# Patient Record
Sex: Female | Born: 1970 | Race: Black or African American | Hispanic: No | Marital: Single | Smoking: Never smoker
Health system: Southern US, Community
[De-identification: ages and names within clinical notes are randomized; demographics above are authoritative.]

## PROBLEM LIST (undated history)

## (undated) HISTORY — PX: TONSILLECTOMY: SUR1361

---

## 2001-07-03 ENCOUNTER — Encounter: Payer: Self-pay | Admitting: Emergency Medicine

## 2001-07-03 ENCOUNTER — Emergency Department (HOSPITAL_COMMUNITY): Admission: EM | Admit: 2001-07-03 | Discharge: 2001-07-03 | Payer: Self-pay | Admitting: Emergency Medicine

## 2002-03-24 ENCOUNTER — Other Ambulatory Visit: Admission: RE | Admit: 2002-03-24 | Discharge: 2002-03-24 | Payer: Self-pay

## 2002-07-20 ENCOUNTER — Other Ambulatory Visit: Admission: RE | Admit: 2002-07-20 | Discharge: 2002-07-20 | Payer: Self-pay | Admitting: Obstetrics & Gynecology

## 2003-06-04 ENCOUNTER — Emergency Department (HOSPITAL_COMMUNITY): Admission: AD | Admit: 2003-06-04 | Discharge: 2003-06-04 | Payer: Self-pay | Admitting: Family Medicine

## 2003-10-03 ENCOUNTER — Other Ambulatory Visit: Admission: RE | Admit: 2003-10-03 | Discharge: 2003-10-03 | Payer: Self-pay | Admitting: Obstetrics & Gynecology

## 2004-04-29 ENCOUNTER — Inpatient Hospital Stay (HOSPITAL_COMMUNITY): Admission: AD | Admit: 2004-04-29 | Discharge: 2004-04-29 | Payer: Self-pay | Admitting: Obstetrics and Gynecology

## 2004-06-17 ENCOUNTER — Inpatient Hospital Stay (HOSPITAL_COMMUNITY): Admission: AD | Admit: 2004-06-17 | Discharge: 2004-06-20 | Payer: Self-pay | Admitting: Obstetrics & Gynecology

## 2004-06-25 ENCOUNTER — Encounter: Admission: RE | Admit: 2004-06-25 | Discharge: 2004-07-25 | Payer: Self-pay | Admitting: Obstetrics & Gynecology

## 2004-07-19 ENCOUNTER — Emergency Department (HOSPITAL_COMMUNITY): Admission: EM | Admit: 2004-07-19 | Discharge: 2004-07-19 | Payer: Self-pay | Admitting: Family Medicine

## 2004-07-26 ENCOUNTER — Encounter: Admission: RE | Admit: 2004-07-26 | Discharge: 2004-08-13 | Payer: Self-pay | Admitting: Obstetrics & Gynecology

## 2004-08-01 ENCOUNTER — Other Ambulatory Visit: Admission: RE | Admit: 2004-08-01 | Discharge: 2004-08-01 | Payer: Self-pay | Admitting: Obstetrics & Gynecology

## 2005-02-20 ENCOUNTER — Emergency Department (HOSPITAL_COMMUNITY): Admission: EM | Admit: 2005-02-20 | Discharge: 2005-02-21 | Payer: Self-pay | Admitting: Emergency Medicine

## 2006-05-05 ENCOUNTER — Emergency Department (HOSPITAL_COMMUNITY): Admission: EM | Admit: 2006-05-05 | Discharge: 2006-05-05 | Payer: Self-pay | Admitting: Emergency Medicine

## 2006-07-07 ENCOUNTER — Emergency Department (HOSPITAL_COMMUNITY): Admission: EM | Admit: 2006-07-07 | Discharge: 2006-07-07 | Payer: Self-pay | Admitting: Emergency Medicine

## 2006-07-24 ENCOUNTER — Emergency Department (HOSPITAL_COMMUNITY): Admission: EM | Admit: 2006-07-24 | Discharge: 2006-07-24 | Payer: Self-pay | Admitting: Family Medicine

## 2007-06-27 ENCOUNTER — Inpatient Hospital Stay (HOSPITAL_COMMUNITY): Admission: AD | Admit: 2007-06-27 | Discharge: 2007-06-27 | Payer: Self-pay | Admitting: Obstetrics and Gynecology

## 2008-01-11 ENCOUNTER — Inpatient Hospital Stay (HOSPITAL_COMMUNITY): Admission: AD | Admit: 2008-01-11 | Discharge: 2008-01-14 | Payer: Self-pay | Admitting: Obstetrics & Gynecology

## 2009-03-31 ENCOUNTER — Emergency Department (HOSPITAL_COMMUNITY): Admission: EM | Admit: 2009-03-31 | Discharge: 2009-03-31 | Payer: Self-pay | Admitting: Emergency Medicine

## 2010-03-18 ENCOUNTER — Encounter: Payer: Self-pay | Admitting: Obstetrics & Gynecology

## 2010-03-21 ENCOUNTER — Ambulatory Visit: Admit: 2010-03-21 | Payer: Self-pay | Admitting: Family Medicine

## 2010-06-04 ENCOUNTER — Other Ambulatory Visit: Payer: Self-pay | Admitting: Obstetrics & Gynecology

## 2010-06-04 DIAGNOSIS — Z1231 Encounter for screening mammogram for malignant neoplasm of breast: Secondary | ICD-10-CM

## 2010-07-10 NOTE — Op Note (Signed)
NAMECASSEY, [name]              ACCOUNT NO.:  [id number]   MEDICAL RECORD NO.:  [id number]          PATIENT TYPE:  INP   LOCATION:  9130                          FACILITY:  [facility]   PHYSICIAN:  Gerrit Friends. Aldona Bar, M.D.   DATE OF BIRTH:  1970/11/09   DATE OF PROCEDURE:  01/11/2008  DATE OF DISCHARGE:                               OPERATIVE REPORT   PREOPERATIVE DIAGNOSES:  1. A 39-week intrauterine pregnancy.  2. Previous cesarean section.  3. Desire for repeat cesarean section.  4. Early active labor.   POSTOPERATIVE DIAGNOSES:  1. A 39-week intrauterine pregnancy.  2. Previous cesarean section.  3. Desire for repeat cesarean section.  4. Early active labor.  5. Delivery of 7 pounds 6 ounces female infant, Apgars of 9 and 9, and      small pedunculated myomas on the surface uterus.   PROCEDURE:  Repeat low transverse cesarean section.   SURGEON:  Gerrit Friends. Aldona Bar, MD   ANESTHESIA:  Subarachnoid block, Dr. Jean Rosenthal.   HISTORY:  This 40 year old gravida 4, para 1 was scheduled for repeat  cesarean section on January 13, 2008, presented to the office on the  afternoon of January 11, 2008, having early irregular labor type  contractions with some discomfort.  Decision was made to proceed with  delivery, and the patient was sent to maternity admissions.  Upon  arrival on maternity admissions, she was appropriately worked up and  transferred to the operating room where her IV was started, and a  preoperative dose of 2 g of ampicillin IV was given because she was  positive for group B strep.   Thereafter, a spinal anesthetic was carried out by Dr. Jean Rosenthal without  difficulty, and after good anesthetic levels were documented with the  patient in the supine position slightly tilted to left, she was prepped  and draped in usual fashion with a Foley catheter inserted as part of  the prep.   At this time with good anesthetic levels documented, procedure was  begun.  A Pfannenstiel  incision was made and dissected down sharply to  and through the fascia in low transverse fashion without difficulty.  Hemostasis was created at each layer.  Subfascial space was created  inferiorly and superiorly, muscles separated in the midline.  Peritoneum  was identified and entered appropriate with care taken to avoid the  bowel superiorly and the bladder inferiorly.  At this time, the  peritoneum was extended, and with minimal difficulty, the bladder blade  placed.  The vesicouterine peritoneum was identified, entered  appropriately in the low transverse fashion, and pushed off the lower  uterine segment with ease.  At this time, a sharp incision into the  uterus in low transverse fashion was made with Metzenbaum scissors and  extended laterally with the fingers.  Fluid was clear, and with the aid  of the vacuum extractor, delivery of the 7-pound 6-ounce female infant  with Apgars of 9 and 9 was carried out from vertex position.   After the cord was clamped and cut, the infant was passed off to the  team and was  subsequently taken to the nursery in good condition.  As  mentioned, weight was 7 pounds 6 ounces, Apgar 9 and 9.   After the cord bloods were collected, the placenta was delivered intact.  Uterus was then exteriorized, rendered free of any remaining products of  conception, and then, good uterine contractility was affordable with  slowly giving intravenous Pitocin and manual stimulation.  At this time,  the uterine incision was closed using a single layer of #1 Vicryl in a  running locking fashion.  This was oversewn with several figure-of-eight  #1 Vicryl for additional hemostasis.  The bladder flap was  reapproximated using 3-0 Vicryl in interrupted fashion.  Tubes and  ovaries were inspected and noted to be normal.  There was a 2- to 3-cm  pedunculated myoma on the right lateral surface of the uterus and  another one on the anterior surface of the uterus.  There were  also some  small what felt to be intramural myomas in the lower right anterior  surface of the uterus at lower right uterine body.   Once the uterine incision was closed with all counts being correct, the  abdomen was lavaged of all free blood and clot.  Then, the uterus was  replaced into abdominal cavity and good irrigation was carried out.  Incision was reinspected and noted to be dry.  At this time, closure of  the abdomen was carried out in layers.  The abdominal peritoneum was  closed with 0 Vicryl in a running fashion and muscles secured with same.  Assured of good fascial hemostasis, the fascia was then reapproximated  using 0 Vicryl from angle to midline bilaterally.   Subcutaneous tissue was rendered hemostatic, and staples were used to  close the skin.  Sterile pressure dressing was applied.  The patient at  this time was transported to recovery in satisfactory condition, having  tolerated the procedure well.  Estimated blood loss 500 mL.  All counts  were correct x2.  At conclusion, mother and baby were both doing well in  their respective recovery areas at the conclusion of procedure.  This  patient presented in early active labor, desirous of repeat cesarean  section, was taken to the operating room for such procedure.  She was  originally scheduled to be done electively on November 18.   All counts correct x2.  Estimated loss blood loss 500 mL.       Gerrit Friends. Aldona Bar, M.D.  Electronically Signed     RMW/MEDQ  D:  01/11/2008  T:  01/12/2008  Job:  [id number]

## 2010-07-12 ENCOUNTER — Ambulatory Visit: Payer: Self-pay

## 2010-07-13 NOTE — Discharge Summary (Signed)
NAMEJENNFIER, [name]              ACCOUNT NO.:  [id number]   MEDICAL RECORD NO.:  [id number]          PATIENT TYPE:  INP   LOCATION:  9130                          FACILITY:  [facility]   PHYSICIAN:  Malva Limes, M.D.    DATE OF BIRTH:  [birth year]   DATE OF ADMISSION:  01/11/2008  DATE OF DISCHARGE:  01/14/2008                               DISCHARGE SUMMARY   FINAL DIAGNOSES:  1. Intrauterine pregnancy at 71 weeks' gestation.  2. History of previous cesarean section.  3. The patient desires repeat cesarean section.  4. Early active labor.   PROCEDURE:  Repeat low transverse cesarean section.   SURGEON:  Gerrit Friends. Aldona Bar, MD   COMPLICATIONS:  None.   This 40 year old G4, P1-0-2-1, presents at 82 weeks' gestation in early  labor.  The patient had a prior cesarean section with her last pregnancy  and desired to repeat with this pregnancy as well as scheduled for one  on January 13, 2008.  Because of active labor at this point, a decision  was made to proceed with a cesarean section currently.  Otherwise, the  patient's antepartum course up to this point had been complicated by  advanced maternal age.  The patient declined any genetic screening.  She  is positive group B strep and did receive 2 g of ampicillin IV prior to  her section.  The patient was taken to the operating room on January 11, 2008, by Dr. Annamaria Helling, a repeat low transverse cesarean section  was performed with the delivery of 7 pounds 6 ounces female infant with  Apgars of 9 and 9.  There was a small pedunculated myoma on the cervix  of the uterus noted.  The delivery without complications.  The patient's  postoperative course was benign, without any significant fevers.  The  patient did want her little boy circumcised prior to discharge, which  occurred, and the patient was felt ready for discharge on postoperative  day #3.  She was sent home on a regular diet, told to decrease  activities, told to continue her  prenatal vitamins, was given Percocet 1-  2 every 4-6 hours as needed for pain, and was to follow up in our office  in 4-6 weeks.  Instructions and precautions were reviewed with the  patient.   LABS ON DISCHARGE:  The patient had a hemoglobin of 10.3, white blood  cell count 9.1, and platelets of 122,000.     Leilani Able, P.A.-C.      ______________________________  Malva Limes, M.D.   MB/MEDQ  D:  02/14/2008  T:  02/14/2008  Job:  [id number]

## 2010-07-13 NOTE — Op Note (Signed)
NAMEVALEN, [name]              ACCOUNT NO.:  [id number]   MEDICAL RECORD NO.:  [id number]          PATIENT TYPE:  INP   LOCATION:  9145                          FACILITY:  [facility]   PHYSICIAN:  Freddy Finner, M.D.   DATE OF BIRTH:  10-Dec-1970   DATE OF PROCEDURE:  06/17/2004  DATE OF DISCHARGE:                                 OPERATIVE REPORT   PREOPERATIVE DIAGNOSES:  1.  Intrauterine pregnancy at term.  2.  Failure to progress in labor due to cephalopelvic disproportion.  3.  Nonreassuring fetal heart tracing.  4.  Meconium staining of amniotic fluid.   POSTOPERATIVE DIAGNOSES:  1.  Intrauterine pregnancy at term.  2.  Failure to progress in labor due to cephalopelvic disproportion.  3.  Nonreassuring fetal heart tracing.  4.  Meconium staining of amniotic fluid.   OPERATIVE PROCEDURE:  Primary low transverse cervical cesarean section with  delivery of viable female infant, Apgars of 8/9 assigned by NICU attendants.  Arterial cord pH is pending.   SURGEON:  Freddy Finner, M.D.   ANESTHESIA:  Epidural.   ESTIMATED INTRAOPERATIVE BLOOD LOSS:  Less than or equal to 800 mL.   SECONDARY DIAGNOSES:  Fundal fibroids, one near the right cornu posterior to  the interstitial portion of the fallopian tube on the right and one  pedunculated lying on the anterior fundal surface near the apex of the  uterus.   INTRAOPERATIVE COMPLICATIONS:  None.   PACKS, DRAINS, CATHETERS:  Foley catheter.   The patient is a 40 year old admitted in labor, who had spontaneous rupture  of membranes after arrival at the hospital.  Meconium staining was noted.  Amnioinfusion was started shortly thereafter.  The patient was initially was  2 cm dilated, 80% effaced, with a vertex presentation at -2 and -3 station.  The patient was noted to be 4 feet 11 inches.  There was concern about  cephalopelvic disproportion early on in her labor, but with intrauterine  pressure,  it was elected to augment her  labor, and she did progress to  approximately 4 cm dilatation, 90% effacement.  She remained at this  cervical number for at least 2 hours with adequate [place] units of  labor.  Vertex was still at a -2 station as evolving.  A full heart tracing  had shown some decrease in variability and had moderately severe  deceleration with a falling blood pressure which did respond to Nifedin.  The variability did not return to pre-deceleration levels.  Based on all of  these parameters, it was elected to proceed with cesarean delivery.   The patient was brought to the operating room.  Epidural was placed for  surgery.  She was placed in dorsorecumbent position with elevation of the  right hip 15 degrees.  The abdomen was prepped and draped in the usual  fashion.  Foley catheter was indwelling.  Lower abdominal transverse  incision was made and carried sharply down to fascia which was then sharply  extended to  the extent of extent of skin incision.  Rectocele was developed  superiorly and inferiorly with blunt  and sharp dissection.  Rectus muscles  were divided in line.  Peritoneum was elevated, entered sharply, and  extended to the extent of skin incision.  Bladder blade was placed.  Transverse incision was made in the visceroperitoneum overlying the lower  uterine segment, and bladder resected off lower segment.  Transverse  incision was made in lower segment and extended bluntly and finger  dissection in a transverse direction.  Thick, fresh meconium was noted on  entry into the uterine cavity.  The infant's head was then delivered without  difficulty.  Bulb and DeLee suctioning were carried out extensively.  The  infant was then delivered without further difficulty.  Apgars were 8/9 by  NICU attendants.  Arterial cord pH was 7.23.  Placenta and other parts of  conception removed from the uterus.  The uterus was delivered onto the  anterior abdominal wall.  Remainder expression of uterus  confirmed  __________ of conception.  Uterine incision was closed with a running  locking Zero Monocryl and an imbricating Zero Monocryl suture for the second  layer.  Tubes and ovaries were normal.  Fibroids were noted above.  Uterus  was placed back into the abdominal cavity.  Irrigation was carried out.  Hemostasis was complete.  All pack, needle, and instrument counts were  correct.  The abdominal incision was closed in layers.  Running Zero  Monocryl was used to close the peritoneum and reapproximate the rectus  muscles.  Running Zero PDS was used to close the fascia.  Subcutaneous  tissue was approximated with running 2-0 plain.  Skin was closed with wide  skin staples.  The patient tolerated the operative procedure well and was  taken to recovery in good condition.      WRN/MEDQ  D:  06/17/2004  T:  06/17/2004  Job:  [id number]

## 2010-07-13 NOTE — Discharge Summary (Signed)
NAMESHAINE, MOUNT              ACCOUNT NO.:  [id number]   MEDICAL RECORD NO.:  [id number]          PATIENT TYPE:  INP   LOCATION:  9123                          FACILITY:  [facility]   PHYSICIAN:  Freddy Finner, M.D.   DATE OF BIRTH:  11-04-1970   DATE OF ADMISSION:  06/17/2004  DATE OF DISCHARGE:  06/20/2004                                 DISCHARGE SUMMARY   ADMITTING DIAGNOSES:  1.  Intrauterine pregnancy at term.  2.  Spontaneous rupture of membranes with meconium-stained fluid.   DISCHARGE DIAGNOSES:  1.  Status post low transverse cesarean section secondary to nonreassuring      fetal heart tones and cephalopelvic disproportion.  2.  Viable female infant.   PROCEDURE:  Primary low transverse cesarean section.   REASON FOR ADMISSION:  Please see written H&P.   HOSPITAL COURSE:  The patient was a 40 year old married white female gravida  3 para 0 that presented to [facility] in early labor.  Spontaneous rupture of membranes occurred on arrival in labor and delivery  with noted meconium-stained fluid. Amnioinfusion was started shortly  thereafter. On admission the patient was noted to be 2 cm dilated, 80%  effaced, vertex at a -2 to -3 station. There was some concern regarding  cephalopelvic disproportion early on in her labor. Intrauterine pressure  catheter was placed with Pitocin augmentation of her labor. The patient did  progress to 4 cm dilated and 90% effaced. The patient did remain at this  cervical dilatation for approximately 2 hours despite adequate labor as  determined by [place] units of labor. The decision was made to proceed  with a low transverse cesarean section. The patient was transferred to the  operating room where epidural was dosed to an adequate surgical level. A low  transverse incision was made with the delivery of a viable female infant  weighing 7 pounds 4 ounces with Apgars of 8 at one minute and 9 at five  minutes. The patient was  also noted to have some fundal fibroids at the time  of her delivery. The patient tolerated the procedure well and was taken to  the recovery room in stable condition. On postoperative day #1, the patient  was without complaint. Vital signs were stable. Abdomen was soft with good  return of bowel function. Fundus was firm and nontender. Abdominal dressing  was noted to be clean, dry, and intact. Foley was draining with adequate  urine output. Laboratory findings revealed hemoglobin of 10.8; platelet  count 145,000; wbc count of 13.9. On postoperative day #2, the patient was  without complaint. Vital signs were stable. Temperature max had been 100.3.  Abdomen was soft, fundus was firm. Incision was clean, dry, and intact. On  postoperative day #3, the patient was without complaint. Vital signs were  stable, she was afebrile. Abdomen was soft. Fundus was firm and nontender.  Incision was clean, dry, and intact. Staples were removed and the patient  was discharged home.   CONDITION ON DISCHARGE:  Good.   DIET:  Regular as tolerated.   ACTIVITY:  No heavy lifting, no  driving x2 weeks, no vaginal entry.   FOLLOW-UP:  The patient is to follow up in the office in 1-2 weeks for an  incision check. She is to call for temperature greater than 100 degrees,  persistent nausea and vomiting, heavy vaginal bleeding, and/or redness or  drainage from the incisional site.   DISCHARGE MEDICATIONS:  1.  Percocet 5/325 #30 one p.o. q.4-6h. p.r.n.  2.  Motrin 600 mg q.6h.  3.  Prenatal vitamins one p.o. daily.  4.  Colace one p.o. daily p.r.n.      CC/MEDQ  D:  07/13/2004  T:  07/13/2004  Job:  [id number]

## 2010-11-27 LAB — CBC
HCT: 29.9 — ABNORMAL LOW
HCT: 35.2 — ABNORMAL LOW
Hemoglobin: 10.3 — ABNORMAL LOW
Hemoglobin: 11.8 — ABNORMAL LOW
MCHC: 33.6
MCV: 87.3
MCV: 87.3
Platelets: 122 — ABNORMAL LOW
Platelets: 147 — ABNORMAL LOW
RBC: 4.03
RDW: 15.5
WBC: 9.1
WBC: 9.3

## 2010-11-27 LAB — RPR: RPR Ser Ql: NONREACTIVE

## 2011-05-22 ENCOUNTER — Emergency Department (INDEPENDENT_AMBULATORY_CARE_PROVIDER_SITE_OTHER)
Admission: EM | Admit: 2011-05-22 | Discharge: 2011-05-22 | Disposition: A | Discharge status: Home / Self Care | Payer: Self-pay | Source: Home / Self Care | Attending: Family Medicine | Admitting: Family Medicine

## 2011-05-22 ENCOUNTER — Encounter (HOSPITAL_COMMUNITY): Payer: Self-pay

## 2011-05-22 DIAGNOSIS — IMO0002 Reserved for concepts with insufficient information to code with codable children: Secondary | ICD-10-CM

## 2011-05-22 MED ORDER — MUPIROCIN 2 % EX OINT: TOPICAL_OINTMENT | Freq: Three times a day (TID) | CUTANEOUS | Status: AC

## 2011-05-22 NOTE — ED Notes (Signed)
patient called, upset that we did not prescribe silvadine cream for her skin problem, states she told us she already had mupirocin ointment at home; Patient told both this Clinical research associate AND the attending MD that she was using silvadine, and made no mention to either that she had or was using mupirocin to either of [place]. Per Dr Juanetta Gosling authorization, pt has [employer] for 50 GM tube of silvadine cream, apply to affected area BID after cleansing , cover w dressing  , called in [facility] [address] at patient request

## 2011-05-22 NOTE — ED Provider Notes (Signed)
History     CSN: [id number]  Arrival date & time 05/22/11  1820   First MD Initiated Contact with Patient 05/22/11 1854      Chief Complaint  Patient presents with  . Hand Pain    (Consider location/radiation/quality/duration/timing/severity/associated sxs/prior treatment) HPI Comments: [name] presents for evaluation of pain, swelling, and redness on the lateral aspect of her left thumb nail bed. 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 She states it was bigger and worse a couple days ago, but does so, using an antibiotic ointment. She also cut her nail, down some too. She reports having a similar episode several years ago, where she had to have it drained by needle.  Patient is a 41 y.o. female presenting with hand pain. The history is provided by the patient.  Hand Pain This is a new problem. The problem occurs constantly. The problem has been gradually improving.    History reviewed. No pertinent past medical history.  History reviewed. No pertinent past surgical history.  History reviewed. No pertinent family history.  History  Substance Use Topics  . Smoking status: Never Smoker   . Smokeless tobacco: Not on file  . Alcohol Use: No    OB History    Grav Para Term Preterm Abortions TAB SAB Ect Mult Living                  Review of Systems  Constitutional: Negative.   HENT: Negative.   Eyes: Negative.   Respiratory: Negative.   Cardiovascular: Negative.   Gastrointestinal: Negative.   Genitourinary: Negative.   Musculoskeletal: Negative.   Skin: Positive for wound.       Inflammation and mild induration of lateral aspect of nail fold of LEFT thumb  Neurological: Negative.     Allergies  Review of patient's allergies indicates no known allergies.  Home Medications   Current Outpatient Rx  Name Route Sig Dispense Refill  . MUPIROCIN 2 % EX OINT Topical Apply topically 3 (three) times  daily. 22 g 0    BP 163/90  Pulse 91  Temp(Src) 98.5 F (36.9 C) (Oral)  Resp 10  SpO2 100%  LMP 05/08/2011  Physical Exam  Nursing note and vitals reviewed. Constitutional: She is oriented to person, place, and time. She appears well-developed and well-nourished.  HENT:  Head: Normocephalic and atraumatic.  Eyes: EOM are normal.  Neck: Normal range of motion.  Pulmonary/Chest: Effort normal.  Musculoskeletal: Normal range of motion.       Hands: Neurological: She is alert and oriented to person, place, and time.  Skin: Skin is warm and dry.       Inflammation and mild induration of lateral aspect of nail fold of LEFT thumb; no erythema, no fluctuance  Psychiatric: Her behavior is normal.    ED Course  Procedures (including critical care time)  Labs Reviewed - No data to display No results found.   1. Paronychia       MDM  Drained itself spontaneously; advised to continue soaks with soap and water        Renaee Munda, MD 05/25/11 2246

## 2011-05-22 NOTE — Discharge Instructions (Signed)
Keep finger area clean, with soap and water, as discussed. Apply antibiotic ointment as directed. Also, soak finger in dilute peroxide mixture or an Epsom salt mixture several times a day. Please follow up here should symptoms worsen such as increased redness, warmth, drainage, or pain.

## 2011-05-22 NOTE — ED Notes (Signed)
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

## 2011-05-27 ENCOUNTER — Telehealth (HOSPITAL_COMMUNITY): Payer: Self-pay | Admitting: *Deleted

## 2011-05-27 NOTE — ED Notes (Signed)
Pt. called in and said she is using the Silvadene and her finger is not getting any better. I checked her chart and it said Mupirocin oint. She said they called in the Silvadene. She said the last time she got pills to help the inside get better and it got better.  She said Dr. Juanetta Gosling told her to call back if it was not getting better. She said she does not have the time to be sitting up here again. Discussed with Dr. Juanetta Gosling and he said,it was not that bad when he saw it, if pt. not getting better to come back and be rechecked because it may need to be drained.  I told pt. this and she said got angry and said she can't afford to pay a sitter to watch her children to come up there to be seen again.  She asked to talk to Dr. Juanetta Gosling. I told her he is busy seeing patients. I talked to Dr. Juanetta Gosling again and he said it is at her own risk, she can take Keflex 250 mg. QID x 5 days. I told pt. this and she wants Doxycycline. I told her I was not going bother Dr. Juanetta Gosling again.  She can't tell him what to prescribe.  She said she had it before and it worked and she knows how her body reacts to it.  I asked if she had any allergies and she said no. I asked what pharmacy I can call this Rx. and [facility] [address]. Rx. called to pharmacist @ [phone].  Vassie Moselle 05/27/2011

## 2011-09-20 ENCOUNTER — Other Ambulatory Visit: Payer: Self-pay | Admitting: Obstetrics & Gynecology

## 2011-09-20 DIAGNOSIS — Z1231 Encounter for screening mammogram for malignant neoplasm of breast: Secondary | ICD-10-CM

## 2011-09-25 ENCOUNTER — Other Ambulatory Visit: Payer: Self-pay | Admitting: Obstetrics & Gynecology

## 2011-09-25 ENCOUNTER — Ambulatory Visit
Admission: RE | Admit: 2011-09-25 | Discharge: 2011-09-25 | Disposition: A | Discharge status: Home / Self Care | Payer: 59 | Source: Ambulatory Visit | Attending: Obstetrics & Gynecology | Admitting: Obstetrics & Gynecology

## 2011-09-25 DIAGNOSIS — Z1231 Encounter for screening mammogram for malignant neoplasm of breast: Secondary | ICD-10-CM

## 2016-10-03 ENCOUNTER — Emergency Department (HOSPITAL_COMMUNITY)
Admission: EM | Admit: 2016-10-03 | Discharge: 2016-10-03 | Disposition: A | Discharge status: Home / Self Care | Payer: Self-pay | Attending: Emergency Medicine | Admitting: Emergency Medicine

## 2016-10-03 ENCOUNTER — Emergency Department (HOSPITAL_COMMUNITY): Payer: Self-pay

## 2016-10-03 ENCOUNTER — Encounter (HOSPITAL_COMMUNITY): Payer: Self-pay | Admitting: Emergency Medicine

## 2016-10-03 DIAGNOSIS — R0602 Shortness of breath: Secondary | ICD-10-CM | POA: Insufficient documentation

## 2016-10-03 DIAGNOSIS — H538 Other visual disturbances: Secondary | ICD-10-CM | POA: Insufficient documentation

## 2016-10-03 DIAGNOSIS — M79604 Pain in right leg: Secondary | ICD-10-CM | POA: Insufficient documentation

## 2016-10-03 DIAGNOSIS — R51 Headache: Secondary | ICD-10-CM | POA: Insufficient documentation

## 2016-10-03 DIAGNOSIS — G8929 Other chronic pain: Secondary | ICD-10-CM | POA: Insufficient documentation

## 2016-10-03 DIAGNOSIS — M542 Cervicalgia: Secondary | ICD-10-CM | POA: Insufficient documentation

## 2016-10-03 DIAGNOSIS — M5431 Sciatica, right side: Secondary | ICD-10-CM | POA: Insufficient documentation

## 2016-10-03 DIAGNOSIS — R0789 Other chest pain: Secondary | ICD-10-CM | POA: Insufficient documentation

## 2016-10-03 LAB — CBC
HEMATOCRIT: 40.6 % (ref 36.0–46.0)
HEMOGLOBIN: 13.4 g/dL (ref 12.0–15.0)
MCH: 28.9 pg (ref 26.0–34.0)
MCHC: 33 g/dL (ref 30.0–36.0)
MCV: 87.7 fL (ref 78.0–100.0)
Platelets: 272 10*3/uL (ref 150–400)
RBC: 4.63 MIL/uL (ref 3.87–5.11)
RDW: 13.7 % (ref 11.5–15.5)
WBC: 8.6 10*3/uL (ref 4.0–10.5)

## 2016-10-03 LAB — BASIC METABOLIC PANEL
ANION GAP: 10 (ref 5–15)
BUN: 11 mg/dL (ref 6–20)
CO2: 22 mmol/L (ref 22–32)
Calcium: 8.7 mg/dL — ABNORMAL LOW (ref 8.9–10.3)
Chloride: 105 mmol/L (ref 101–111)
Creatinine, Ser: 0.96 mg/dL (ref 0.44–1.00)
GFR calc non Af Amer: >60 mL/min (ref >60)
Glucose, Bld: 92 mg/dL (ref 65–99)
POTASSIUM: 4.1 mmol/L (ref 3.5–5.1)
Sodium: 137 mmol/L (ref 135–145)

## 2016-10-03 LAB — POCT I-STAT TROPONIN I: TROPONIN I, POC: 0.01 ng/mL (ref 0.00–0.08)

## 2016-10-03 MED ORDER — OXYCODONE-ACETAMINOPHEN 5-325 MG PO TABS
1.0000 | ORAL_TABLET | Freq: Once | ORAL | Status: AC
  Administered 2016-10-03: 1 via ORAL
  Filled 2016-10-03: qty 1

## 2016-10-03 MED ORDER — METHYLPREDNISOLONE 4 MG PO TBPK: ORAL_TABLET | ORAL | 0 refills | Status: AC

## 2016-10-03 MED ORDER — ACETAMINOPHEN 325 MG PO TABS
650.0000 mg | ORAL_TABLET | Freq: Once | ORAL | Status: DC
  Filled 2016-10-03: qty 2

## 2016-10-03 MED ORDER — OXYCODONE-ACETAMINOPHEN 5-325 MG PO TABS: 1.0000 | ORAL_TABLET | Freq: Four times a day (QID) | ORAL | 0 refills | Status: AC | PRN

## 2016-10-03 NOTE — ED Notes (Signed)
Attempted IV, unsuccessful at this time. Will let PA know and another RN to try if needed.

## 2016-10-03 NOTE — ED Triage Notes (Addendum)
Pt states that she has had chest pain and R leg pain and inability to move the R side of her body since yesterday. Alert and oriented.

## 2016-10-03 NOTE — ED Notes (Signed)
Patient refused lab draw @ this time. Triage RN made aware.

## 2016-10-03 NOTE — Discharge Instructions (Signed)
Please read attached information regarding your condition. Take steroids as directed. Take pain medication as needed for severe pain. Use warm compresses and stretching area as tolerated. Follow-up with PCP for further evaluation if symptoms persist. Return to ED for worsening chest pain, trouble breathing, coughing up blood, numbness, weakness, falls, head injuries.

## 2016-10-03 NOTE — ED Notes (Signed)
Patient does not need IV per PA

## 2016-10-03 NOTE — ED Provider Notes (Signed)
[facility]-EMERGENCY DEPT Provider Note   CSN: [id number] Arrival date & time: 10/03/16  1538     History   Chief Complaint Chief Complaint  Patient presents with  . Chest Pain  . Leg Pain    HPI [name] is a 46 y.o. female.  HPI  Patient presents to ED for evaluation of 3 day history of chest pain, shortness of breath and right leg pain. She states that symptoms began after her step class and she is unsure if symptoms are caused by a pulled muscle. States that the leg pain is causing her to not know the right side of her body. She states chest pain, shortness of breath with exertion as well as rest. She denies any previous history of similar symptoms. She also reports chronic headache and neck pain for the past year, associated with blurry vision. She has no past medical history but she has not seen a family doctor for the past 9 years. She denies any hemoptysis, prior MI, DVT, PE, falls, injuries, vomiting, urinary symptoms, vaginal complaints.  No past medical history on file.  There are no active problems to display for this patient.   Past Surgical History:  Procedure Laterality Date  . TONSILLECTOMY      OB History    No data available       Home Medications    Prior to Admission medications   Medication Sig Start Date End Date Taking? Authorizing Provider  Acetaminophen (PAIN RELIEF PO) Take 1 tablet by mouth daily as needed (pain).   Yes [provider]  methylPREDNISolone (MEDROL DOSEPAK) 4 MG TBPK tablet Taper over 6 days. 10/03/16   Adara Kittle, PA-C  oxyCODONE-acetaminophen (PERCOCET/ROXICET) 5-325 MG tablet Take 1 tablet by mouth every 6 (six) hours as needed for severe pain. 10/03/16   Dietrich Pates, PA-C    Family History No family history on file.  Social History Social History  Substance Use Topics  . Smoking status: Never Smoker  . Smokeless tobacco: Not on file  . Alcohol use No     Allergies   Patient has no known  allergies.   Review of Systems Review of Systems  Constitutional: Negative for appetite change, chills and fever.  HENT: Negative for ear pain, rhinorrhea, sneezing and sore throat.   Eyes: Positive for visual disturbance. Negative for photophobia.  Respiratory: Positive for shortness of breath. Negative for cough, chest tightness and wheezing.   Cardiovascular: Positive for chest pain. Negative for palpitations.  Gastrointestinal: Negative for abdominal pain, blood in stool, constipation, diarrhea, nausea and vomiting.  Genitourinary: Negative for dysuria, hematuria and urgency.  Musculoskeletal: Positive for arthralgias, gait problem and neck pain. Negative for myalgias.  Skin: Negative for rash.  Neurological: Positive for headaches. Negative for dizziness, weakness and light-headedness.     Physical Exam Updated Vital Signs BP (!) 178/97   Pulse 76   Temp 98.4 F (36.9 C) (Oral)   Resp 15   LMP 09/29/2016 Comment: preg test wavier signed on 10/03/16  SpO2 100%   Physical Exam  Constitutional: She is oriented to person, place, and time. She appears well-developed and well-nourished. No distress.  HENT:  Head: Normocephalic and atraumatic.  Nose: Nose normal.  Eyes: Pupils are equal, round, and reactive to light. Conjunctivae and EOM are normal. Right eye exhibits no discharge. Left eye exhibits no discharge. No scleral icterus.  Neck: Normal range of motion. Neck supple.  Cardiovascular: Normal rate, regular rhythm, normal heart sounds and intact distal  pulses.  Exam reveals no gallop and no friction rub.   No murmur heard. Pulmonary/Chest: Effort normal and breath sounds normal. No respiratory distress.  Abdominal: Soft. Bowel sounds are normal. She exhibits no distension. There is no tenderness. There is no guarding.  Musculoskeletal: Normal range of motion. She exhibits tenderness. She exhibits no edema.       Legs: TTP in midline of cervical spine. No midline spinal  tenderness present in lumbar, thoracic spine. No step-off palpated.  TTP of R lateral thigh.  No visible bruising, edema or temperature change noted. No objective signs of numbness present. No saddle anesthesia. 2+ DP pulses bilaterally. Sensation intact to light touch. Strength 5/5 in bilateral lower extremities.  Neurological: She is alert and oriented to person, place, and time. No cranial nerve deficit or sensory deficit. She exhibits normal muscle tone. Coordination normal.  Pupils reactive. No facial asymmetry noted. Cranial nerves appear grossly intact. Sensation intact to light touch on face, BUE and BLE. Strength 5/5 in BUE and BLE. Normal patellar reflexes bilaterally.  Skin: Skin is warm and dry. No rash noted.  Psychiatric: She has a normal mood and affect.  Nursing note and vitals reviewed.    ED Treatments / Results  Labs (all labs ordered are listed, but only abnormal results are displayed) Labs Reviewed  BASIC METABOLIC PANEL - Abnormal; Notable for the following:       Result Value   Calcium 8.7 (*)    All other components within normal limits  CBC  PREGNANCY, URINE  I-STAT TROPONIN, ED  POCT I-STAT TROPONIN I    EKG  EKG Interpretation  Date/Time:  Thursday October 03 2016 15:57:02 EDT Ventricular Rate:  81 PR Interval:    QRS Duration: 91 QT Interval:  381 QTC Calculation: 443 R Axis:   14 Text Interpretation:  Sinus rhythm No old tracing to compare TWI in lead III, no other changes Confirmed by Shaune PollackIsaacs, Cameron [phone]([id number]) on 10/03/2016 7:16:10 PM       Radiology Dg Chest 2 View  Result Date: 10/03/2016 CLINICAL DATA:  Chest pain since Monday EXAM: CHEST  2 VIEW COMPARISON:  None. FINDINGS: Borderline cardiomegaly. Lungs are under aerated and grossly clear. No pneumothorax or pleural effusion IMPRESSION: No active cardiopulmonary disease. Electronically Signed   By: Jolaine ClickArthur  Hoss M.D.   On: 10/03/2016 16:51   Ct Head Wo Contrast  Result Date:  10/03/2016 CLINICAL DATA:  Headache and inability to move right-side-up body. EXAM: CT HEAD WITHOUT CONTRAST CT CERVICAL SPINE WITHOUT CONTRAST TECHNIQUE: Multidetector CT imaging of the head and cervical spine was performed following the standard protocol without intravenous contrast. Multiplanar CT image reconstructions of the cervical spine were also generated. COMPARISON:  None. FINDINGS: CT HEAD FINDINGS Brain: There is no evidence for acute hemorrhage, hydrocephalus, mass lesion, or abnormal extra-axial fluid collection. No definite CT evidence for acute infarction. Vascular: No hyperdense vessel or unexpected calcification. Skull: No evidence for fracture. No worrisome lytic or sclerotic lesion. Sinuses/Orbits: The visualized paranasal sinuses and mastoid air cells are clear. Visualized portions of the globes and intraorbital fat are unremarkable. Other: None. CT CERVICAL SPINE FINDINGS Alignment: No subluxation.  Straightening normal cervical lordosis. Skull base and vertebrae: Incomplete posterior arch C1. No fracture. No focal worrisome lytic or sclerotic osseous abnormality. Soft tissues and spinal canal: No prevertebral fluid or swelling. No visible canal hematoma. Disc levels:  Preserved. Upper chest: Negative. Other: None. IMPRESSION: 1. Normal noncontrast CT of the brain. 2. No cervical spine  fracture. 3. Loss of cervical lordosis. This can be related to patient positioning, muscle spasm or soft tissue injury. Electronically Signed   By: Kennith Center M.D.   On: 10/03/2016 18:49   Ct Cervical Spine Wo Contrast  Result Date: 10/03/2016 CLINICAL DATA:  Headache and inability to move right-side-up body. EXAM: CT HEAD WITHOUT CONTRAST CT CERVICAL SPINE WITHOUT CONTRAST TECHNIQUE: Multidetector CT imaging of the head and cervical spine was performed following the standard protocol without intravenous contrast. Multiplanar CT image reconstructions of the cervical spine were also generated. COMPARISON:   None. FINDINGS: CT HEAD FINDINGS Brain: There is no evidence for acute hemorrhage, hydrocephalus, mass lesion, or abnormal extra-axial fluid collection. No definite CT evidence for acute infarction. Vascular: No hyperdense vessel or unexpected calcification. Skull: No evidence for fracture. No worrisome lytic or sclerotic lesion. Sinuses/Orbits: The visualized paranasal sinuses and mastoid air cells are clear. Visualized portions of the globes and intraorbital fat are unremarkable. Other: None. CT CERVICAL SPINE FINDINGS Alignment: No subluxation.  Straightening normal cervical lordosis. Skull base and vertebrae: Incomplete posterior arch C1. No fracture. No focal worrisome lytic or sclerotic osseous abnormality. Soft tissues and spinal canal: No prevertebral fluid or swelling. No visible canal hematoma. Disc levels:  Preserved. Upper chest: Negative. Other: None. IMPRESSION: 1. Normal noncontrast CT of the brain. 2. No cervical spine fracture. 3. Loss of cervical lordosis. This can be related to patient positioning, muscle spasm or soft tissue injury. Electronically Signed   By: Kennith Center M.D.   On: 10/03/2016 18:49   Dg Hip Unilat W Or Wo Pelvis 2-3 Views Right  Result Date: 10/03/2016 CLINICAL DATA:  Right leg pain. EXAM: DG HIP (WITH OR WITHOUT PELVIS) 2-3V RIGHT COMPARISON:  None. FINDINGS: There is no evidence of hip fracture or dislocation. There is no evidence of arthropathy or other focal bone abnormality. IMPRESSION: Negative. Electronically Signed   By: Kennith Center M.D.   On: 10/03/2016 18:02    Procedures Procedures (including critical care time)  Medications Ordered in ED Medications  acetaminophen (TYLENOL) tablet 650 mg (650 mg Oral Refused 10/03/16 1900)  oxyCODONE-acetaminophen (PERCOCET/ROXICET) 5-325 MG per tablet 1 tablet (1 tablet Oral Given 10/03/16 1910)     Initial Impression / Assessment and Plan / ED Course  I have reviewed the triage vital signs and the nursing  notes.  Pertinent labs & imaging results that were available during my care of the patient were reviewed by me and considered in my medical decision making (see chart for details).    Patient presents to the ED for evaluation of chest pain and right leg pain. She states that symptoms began 3 days ago. When inquiring further patient states that she is not experiencing chest pain but it is mostly in the back of her neck. She is unsure if leg pain and is related to a pulled muscle. She states that the neck pain as well as a headache has been present for about a year. On physical exam patient is anxious and tearful because she is afraid that there is something wrong and that is why she is avoided going to the doctor. There are no focal findings on neurological exam. She is not tachycardic or tachypneic. She is afebrile with no history of fever. She is nontoxic-appearing in on acute distress. She has tenderness to palpation on the lateral right leg as well as the back of the neck. However there are no objective signs of numbness present on the lower extremity, with  normal pulses and strength. There is no step-off, deformity or bruising noted. Chest x-ray negative. Troponin negative 1. CBC, BMP unremarkable. X-ray of hip negative. CT of the head and C-spine returned as negative. EKG showed normal sinus rhythm. Patient is PERC negative so no further workup for PE is warranted. I suspect that symptoms could be due to sciatica. Patient's pain controlled here in the ED. Will give short course of steroids to help with sciatica and short course of pain medication. Encouraged her to follow-up with PCP for further evaluation if symptoms persist. Patient appears stable for discharge at this time. Strict return precautions given. [place] narcotic database reviewed.  Final Clinical Impressions(s) / ED Diagnoses   Final diagnoses:  Right leg pain  Chest wall pain  Sciatica of right side    New Prescriptions New  Prescriptions   METHYLPREDNISOLONE (MEDROL DOSEPAK) 4 MG TBPK TABLET    Taper over 6 days.   OXYCODONE-ACETAMINOPHEN (PERCOCET/ROXICET) 5-325 MG TABLET    Take 1 tablet by mouth every 6 (six) hours as needed for severe pain.     Dietrich Pates, PA-C 10/03/16 Zachery Dauer, MD 10/04/16 [phone]

## 2018-01-14 IMAGING — CR DG CHEST 2V
2 series · 2 of 2 positions shown · non-contrast
Comparison: None.

CLINICAL DATA: Chest pain since [REDACTED]

EXAM:
CHEST  2 VIEW

[w chest lat]
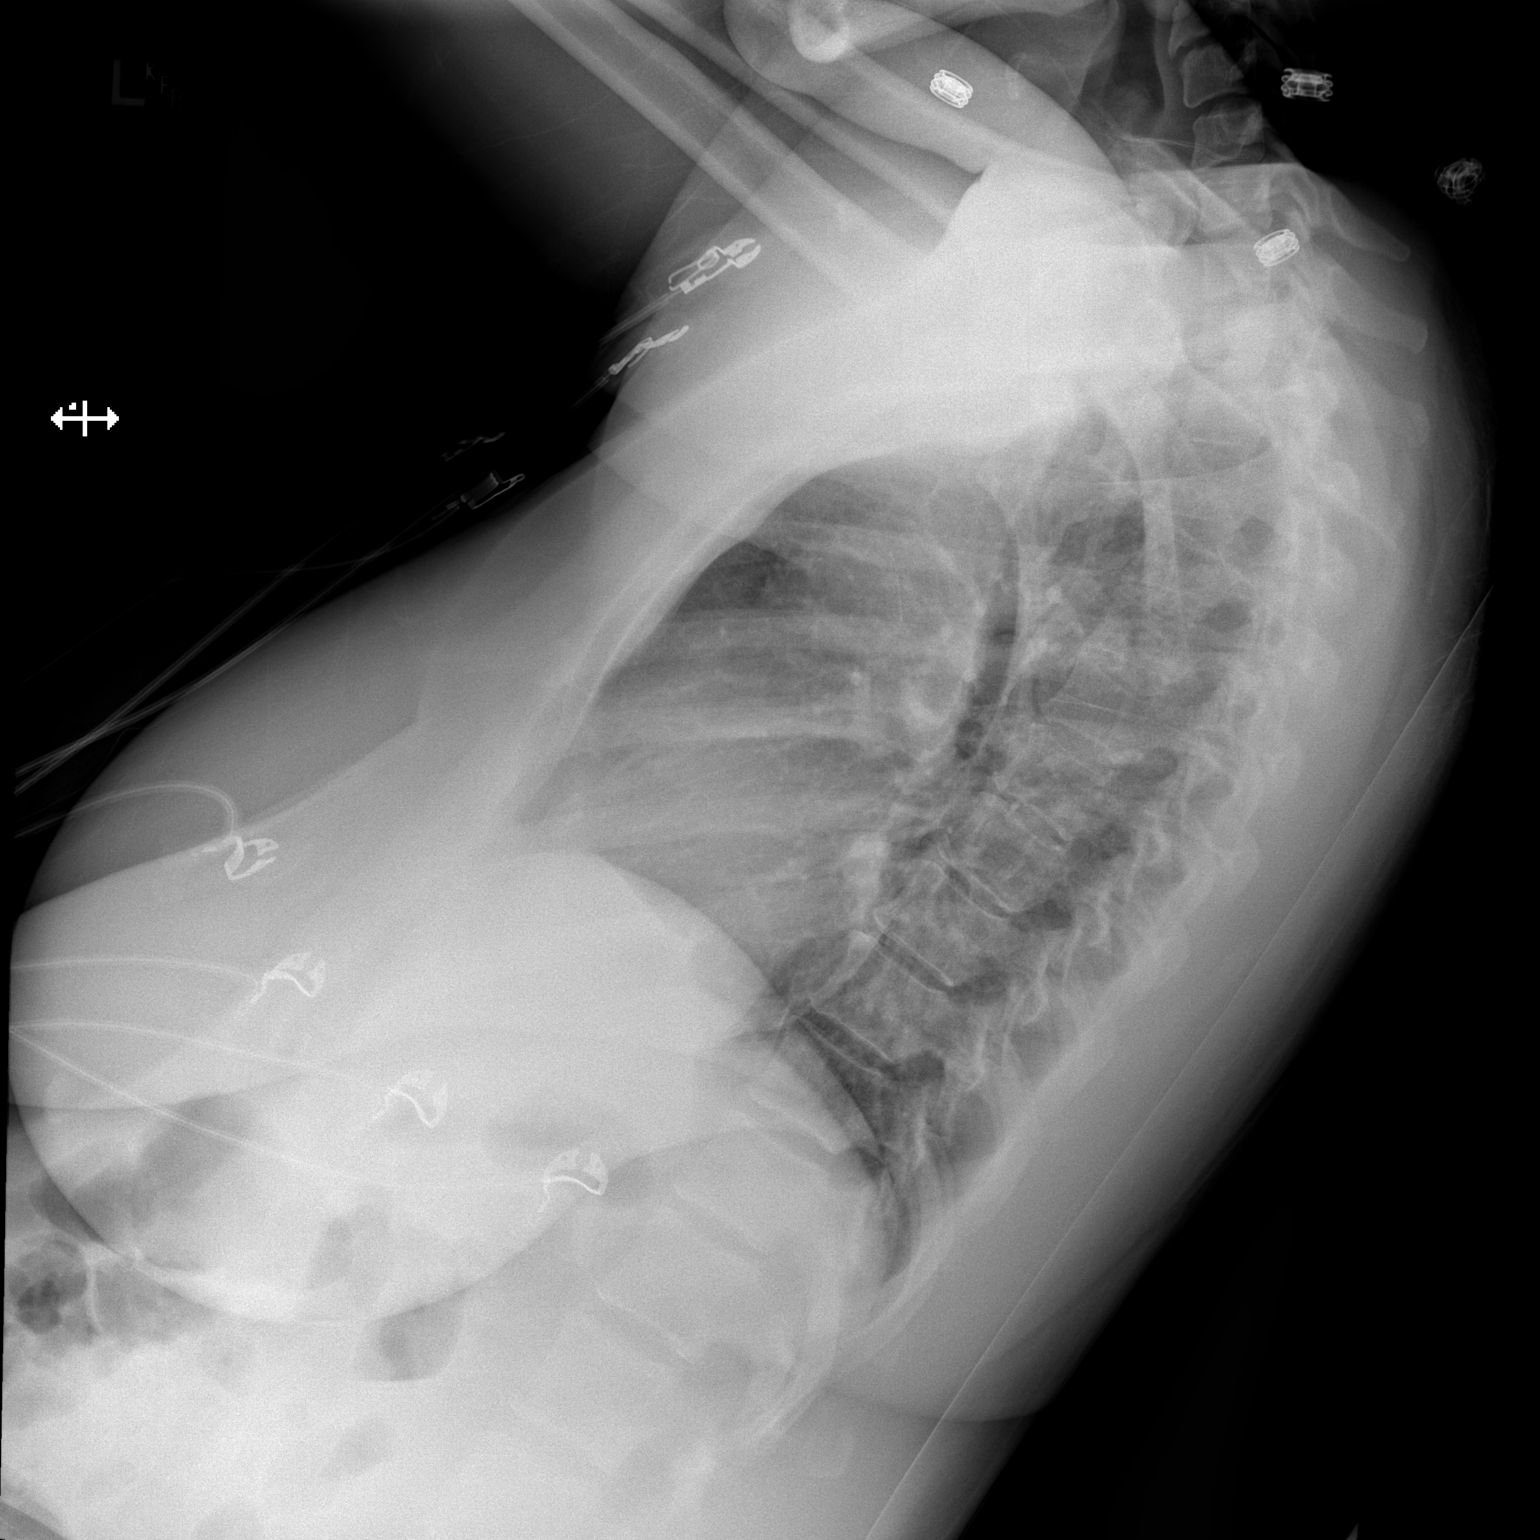

[x chest ap]
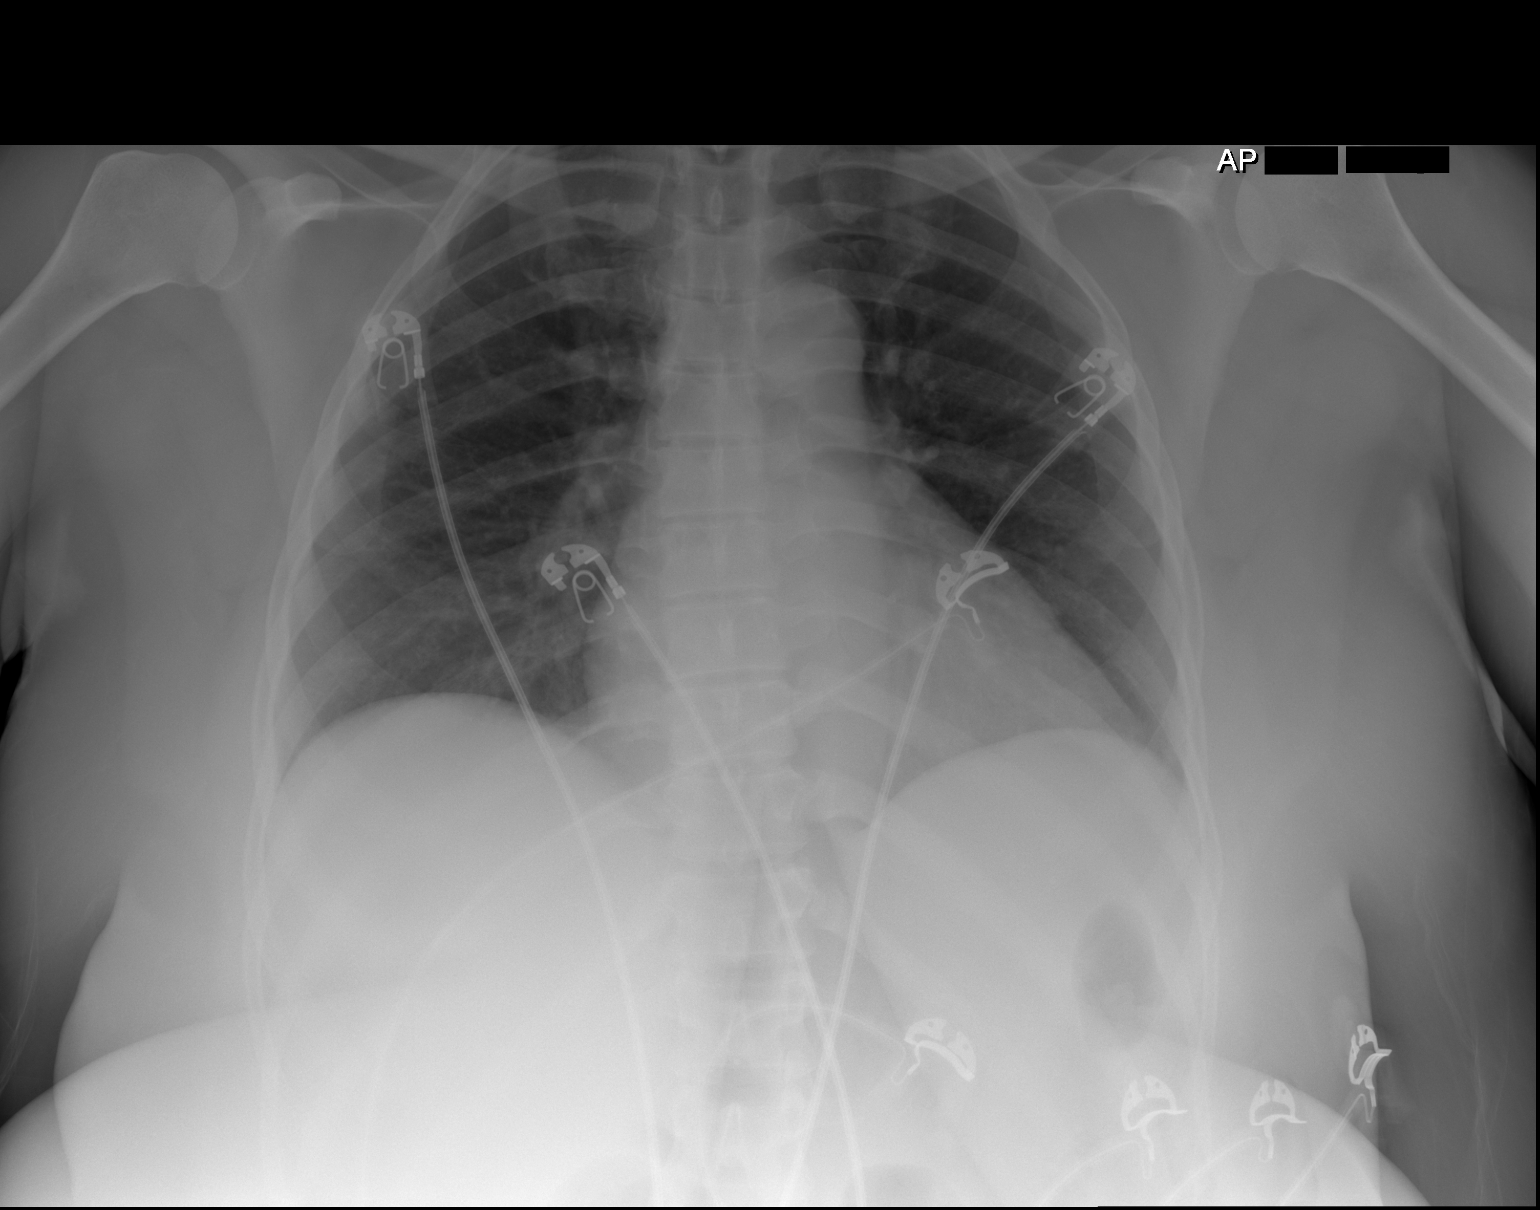

[2 of 2 positions shown; findings below may reference images not displayed]

FINDINGS: Borderline cardiomegaly. Lungs are under aerated and grossly clear.
No pneumothorax or pleural effusion
IMPRESSION: No active cardiopulmonary disease.
